# Patient Record
Sex: Female | Born: 2005 | Race: Black or African American | Hispanic: No | Marital: Single | Smoking: Never smoker
Health system: Southern US, Community
[De-identification: ages and names within clinical notes are randomized; demographics above are authoritative.]

## PROBLEM LIST (undated history)

## (undated) DIAGNOSIS — E282 Polycystic ovarian syndrome: Secondary | ICD-10-CM

## (undated) DIAGNOSIS — M041 Periodic fever syndromes: Secondary | ICD-10-CM

---
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
## 2023-02-27 ENCOUNTER — Ambulatory Visit (HOSPITAL_COMMUNITY)
Admission: RE | Admit: 2023-02-27 | Discharge: 2023-02-27 | Disposition: A | Discharge status: Home / Self Care | Payer: Medicaid Other | Source: Ambulatory Visit | Attending: Nurse Practitioner | Admitting: Nurse Practitioner

## 2023-02-27 ENCOUNTER — Encounter (HOSPITAL_COMMUNITY): Payer: Self-pay

## 2023-02-27 VITALS — BP 118/81 | HR 76 | Temp 98.8°F | Resp 18 | Ht 63.0 in | Wt 200.0 lb

## 2023-02-27 DIAGNOSIS — L02214 Cutaneous abscess of groin: Secondary | ICD-10-CM | POA: Diagnosis not present

## 2023-02-27 HISTORY — DX: Polycystic ovarian syndrome: E28.2

## 2023-02-27 MED ORDER — MUPIROCIN 2 % EX OINT: 1.0000 | TOPICAL_OINTMENT | Freq: Two times a day (BID) | CUTANEOUS | 0 refills | Status: AC

## 2023-02-27 MED ORDER — SULFAMETHOXAZOLE-TRIMETHOPRIM 800-160 MG PO TABS: 1.0000 | ORAL_TABLET | Freq: Two times a day (BID) | ORAL | 0 refills | Status: AC

## 2023-02-27 NOTE — ED Triage Notes (Signed)
2-3 weeks ago Patient found a lump on her bottom. Has been painful and grew, no drainage. Patient has history of PCOS.

## 2023-02-27 NOTE — Discharge Instructions (Signed)
Please start taking the Bactrim DS twice daily (antibiotic) to treat infection in the skin  You can also apply a thin layer of mupirocin ointment twice daily to clean skin to help with infection.  Keep the area clean and dry and clean it twice daily with mild soap and water.  Seek care if symptoms persist or worsen despite treatment.

## 2023-02-27 NOTE — ED Provider Notes (Signed)
[facility]    CSN: [id number] Arrival date & time: 02/27/23  1126      History   Chief Complaint Chief Complaint  Patient presents with   Appointment   Abscess    HPI Isabella Aguilar is a 17 y.o. female.   Patient presents today with swollen area to her right groin that has been present for the past 2-3 weeks.  Reports the area is swollen and tender.  It is not red or draining anything.  She reports a history of a similar type of swelling/pain in her right underarm that improved with warm compresses.  Reports the area under her right underarm seems to come and go.  She has not had any drainage from her underarm so far.  Has not tried anything so far for this area in her groin.  Mom wonders if this may have come from shaving.  Patient denies fever or vomiting.  Reports she has been a little bit nauseous the past few days.  She wonders if it may be coming from eating again during the day as Ramadan just ended.   Patient denies antibiotic use in the past 90 days.    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 No pertinent surgical history.  OB History   No obstetric history on file.      Home Medications    Prior to Admission medications   Medication Sig Start Date End Date Taking? Authorizing Provider  Colchicine 0.6 MG CAPS TAKE ONE CAPSULE BY MOUTH EVERY MORNING AND TWO CAPSULES EVERY EVENING 10/13/21  Yes [provider]  metFORMIN (GLUCOPHAGE) 500 MG tablet Take 1 tablet (500 mg total) by mouth 2 (two) times daily with a meal. 11/23/22  Yes Leftwich-Kirby, Wilmer Floor, CNM  mupirocin ointment (BACTROBAN) 2 % Apply 1 Application topically 2 (two)  times daily. 02/27/23  Yes Valentino Nose, NP  sulfamethoxazole-trimethoprim (BACTRIM DS) 800-160 MG tablet Take 1 tablet by mouth 2 (two) times daily for 7 days. 02/27/23 03/06/23 Yes Cathlean Marseilles A, NP  colchicine 0.6 MG tablet Take 1 tablet (0.6 mg total) by mouth daily. 03/10/14 06/27/14  Truddie Coco, DO  HYDROcodone-acetaminophen (HYCET) 7.5-325 mg/15 ml solution Take 10 mLs by mouth every 6 (six) hours as needed for moderate pain or severe pain. Patient not taking: Reported on 11/23/2022 11/06/14   Viviano Simas, NP  ibuprofen (ADVIL) 200 MG tablet Take by mouth.    [provider]  ibuprofen (ADVIL,MOTRIN) 200 MG tablet Take 400 mg by mouth every 6 (six) hours as needed for headache or moderate pain.    [provider]  ketoconazole (NIZORAL) 2 % cream Apply 1 Application topically daily.    [provider]  triamcinolone lotion (KENALOG) 0.1 % Apply 1 Application topically 3 (three) times daily.    [provider]    Family History Family History  Problem Relation Age of Onset   Diabetes Mother     Social History Social History   Tobacco Use   Smoking status: Never   Smokeless tobacco: Never  Vaping Use   Vaping Use: Never used  Substance Use Topics   Alcohol use: No   Drug use:  Never     Allergies   Grapefruit concentrate   Review of Systems Review of Systems Per HPI  Physical Exam Triage Vital Signs ED Triage Vitals  Enc Vitals Group     BP 02/27/23 1136 118/81     Pulse Rate 02/27/23 1136 76     Resp 02/27/23 1136 18     Temp 02/27/23 1136 98.8 F (37.1 C)     Temp Source 02/27/23 1136 Oral     SpO2 02/27/23 1136 98 %     Weight 02/27/23 1135 200 lb (90.7 kg)     Height 02/27/23 1135 5\' 3"  (1.6 m)     Head Circumference --      Peak Flow --      Pain Score 02/27/23 1133 5     Pain Loc --      Pain Edu? --      Excl. in GC? --    No data found.  Updated Vital Signs BP 118/81 (BP Location: Right Arm)    Pulse 76   Temp 98.8 F (37.1 C) (Oral)   Resp 18   Ht 5\' 3"  (1.6 m)   Wt 200 lb (90.7 kg)   LMP 01/07/2023 (Approximate)   SpO2 98%   BMI 35.43 kg/m   Visual Acuity Right Eye Distance:   Left Eye Distance:   Bilateral Distance:    Right Eye Near:   Left Eye Near:    Bilateral Near:     Physical Exam Vitals and nursing note reviewed. Exam conducted with a chaperone present Aurelio Brash, CMA).  Constitutional:      General: She is not in acute distress.    Appearance: Normal appearance. She is not toxic-appearing.  HENT:     Mouth/Throat:     Mouth: Mucous membranes are moist.     Pharynx: Oropharynx is clear.  Pulmonary:     Effort: Pulmonary effort is normal. No respiratory distress.  Skin:    General: Skin is warm and dry.     Capillary Refill: Capillary refill takes less than 2 seconds.     Findings: Abscess present.          Comments: Approximately 0.25 cm x 0.25 cm abscess to right groin in approximately area marked.  Area is tender to touch; no swelling, erythema, or warmth.  No active drainage.  Neurological:     Mental Status: She is alert and oriented to person, place, and time.  Psychiatric:        Behavior: Behavior is cooperative.      UC Treatments / Results  Labs (all labs ordered are listed, but only abnormal results are displayed) Labs Reviewed - No data to display  EKG   Radiology No results found.  Procedures Procedures (including critical care time)  Medications Ordered in UC Medications - No data to display  Initial Impression / Assessment and Plan / UC Course  I have reviewed the triage vital signs and the nursing notes.  Pertinent labs & imaging results that were available during my care of the patient were reviewed by me and considered in my medical decision making (see chart for details).   Patient is well-appearing, normotensive, afebrile, not tachycardic, not tachypneic, oxygenating well on room air.    1. Abscess of  groin, right Vital signs and examination today are reassuring Suspect abscess versus phlegmonous Will cover for abscess with Bactrim DS twice a day for 7 days along with topical mupirocin ointment twice daily Wound care discussed  Strict ER and return precautions discussed Seek care for persistent or worsening symptoms despite treatment  The patient was given the opportunity to ask questions.  All questions answered to their satisfaction.  The patient is in agreement to this plan.    Final Clinical Impressions(s) / UC Diagnoses   Final diagnoses:  Abscess of groin, right     Discharge Instructions      Please start taking the Bactrim DS twice daily (antibiotic) to treat infection in the skin  You can also apply a thin layer of mupirocin ointment twice daily to clean skin to help with infection.  Keep the area clean and dry and clean it twice daily with mild soap and water.  Seek care if symptoms persist or worsen despite treatment.     ED Prescriptions     Medication Sig Dispense Auth. Provider   sulfamethoxazole-trimethoprim (BACTRIM DS) 800-160 MG tablet Take 1 tablet by mouth 2 (two) times daily for 7 days. 14 tablet Cathlean Marseilles A, NP   mupirocin ointment (BACTROBAN) 2 % Apply 1 Application topically 2 (two) times daily. 22 g Valentino Nose, NP      PDMP not reviewed this encounter.   Valentino Nose, NP 02/27/23 1323

## 2023-03-02 ENCOUNTER — Other Ambulatory Visit: Payer: Self-pay

## 2023-03-02 DIAGNOSIS — N939 Abnormal uterine and vaginal bleeding, unspecified: Secondary | ICD-10-CM

## 2023-03-02 MED ORDER — METFORMIN HCL 500 MG PO TABS: 500.0000 mg | ORAL_TABLET | Freq: Two times a day (BID) | ORAL | 2 refills | Status: DC

## 2023-05-28 ENCOUNTER — Other Ambulatory Visit: Payer: Self-pay

## 2023-05-28 DIAGNOSIS — N939 Abnormal uterine and vaginal bleeding, unspecified: Secondary | ICD-10-CM

## 2023-06-02 ENCOUNTER — Other Ambulatory Visit: Payer: Self-pay | Admitting: *Deleted

## 2023-06-02 ENCOUNTER — Encounter: Payer: Self-pay | Admitting: Advanced Practice Midwife

## 2023-06-02 DIAGNOSIS — N939 Abnormal uterine and vaginal bleeding, unspecified: Secondary | ICD-10-CM

## 2023-06-02 MED ORDER — METFORMIN HCL 500 MG PO TABS: 500.0000 mg | ORAL_TABLET | Freq: Two times a day (BID) | ORAL | 2 refills | Status: DC

## 2023-06-02 NOTE — Progress Notes (Signed)
Metformin RX refilled per Sherilyn Banker, CNM. Pt messaged via [facility]. Advised to follow up with provider on medication.

## 2023-06-07 ENCOUNTER — Ambulatory Visit: Payer: Medicaid Other | Admitting: Advanced Practice Midwife

## 2023-11-29 ENCOUNTER — Other Ambulatory Visit: Payer: Self-pay

## 2023-11-29 DIAGNOSIS — N939 Abnormal uterine and vaginal bleeding, unspecified: Secondary | ICD-10-CM

## 2023-11-29 MED ORDER — METFORMIN HCL 500 MG PO TABS: 500.0000 mg | ORAL_TABLET | Freq: Two times a day (BID) | ORAL | 2 refills | Status: DC

## 2023-11-29 MED ORDER — METFORMIN HCL 500 MG PO TABS: 500.0000 mg | ORAL_TABLET | Freq: Two times a day (BID) | ORAL | 2 refills | Status: AC

## 2023-11-29 NOTE — Progress Notes (Signed)
 Pharmacy requesting refill.
